# Patient Record
Sex: Male | Born: 1990 | Race: Black or African American | Hispanic: No | Marital: Single | Smoking: Never smoker
Health system: Southern US, Community
[De-identification: ages and names within clinical notes are randomized; demographics above are authoritative.]

## PROBLEM LIST (undated history)

## (undated) DIAGNOSIS — J189 Pneumonia, unspecified organism: Secondary | ICD-10-CM

---

## 2014-01-26 ENCOUNTER — Emergency Department (INDEPENDENT_AMBULATORY_CARE_PROVIDER_SITE_OTHER): Payer: BC Managed Care – PPO

## 2014-01-26 ENCOUNTER — Encounter (HOSPITAL_COMMUNITY): Payer: Self-pay | Admitting: Emergency Medicine

## 2014-01-26 ENCOUNTER — Emergency Department (HOSPITAL_COMMUNITY)
Admission: EM | Admit: 2014-01-26 | Discharge: 2014-01-26 | Disposition: A | Discharge status: Home / Self Care | Payer: BC Managed Care – PPO | Source: Home / Self Care | Attending: Emergency Medicine | Admitting: Emergency Medicine

## 2014-01-26 DIAGNOSIS — S239XXA Sprain of unspecified parts of thorax, initial encounter: Secondary | ICD-10-CM

## 2014-01-26 DIAGNOSIS — X58XXXA Exposure to other specified factors, initial encounter: Secondary | ICD-10-CM

## 2014-01-26 DIAGNOSIS — IMO0002 Reserved for concepts with insufficient information to code with codable children: Secondary | ICD-10-CM

## 2014-01-26 HISTORY — DX: Pneumonia, unspecified organism: J18.9

## 2014-01-26 MED ORDER — MELOXICAM 15 MG PO TABS: 15.0000 mg | ORAL_TABLET | Freq: Every day | ORAL | Status: DC

## 2014-01-26 MED ORDER — TRAMADOL HCL 50 MG PO TABS: 100.0000 mg | ORAL_TABLET | Freq: Three times a day (TID) | ORAL | Status: DC | PRN

## 2014-01-26 MED ORDER — METHOCARBAMOL 500 MG PO TABS: 500.0000 mg | ORAL_TABLET | Freq: Three times a day (TID) | ORAL | Status: DC

## 2014-01-26 NOTE — Discharge Instructions (Signed)
Do exercises twice daily followed by moist heat for 15 minutes. ° ° ° ° ° °Try to be as active as possible. ° °If no better in 2 weeks, follow up with orthopedist. ° ° °TREATMENT  °Treatment initially involves the use of ice and medication to help reduce pain and inflammation. It is also important to perform strengthening and stretching exercises and modify activities that worsen symptoms so the injury does not get worse. These exercises may be performed at home or with a therapist. For patients who experience severe symptoms, a soft padded collar may be recommended to be worn around the neck.  °Improving your posture may help reduce symptoms. Posture improvement includes pulling your chin and abdomen in while sitting or standing. If you are sitting, sit in a firm chair with your buttocks against the back of the chair. While sleeping, try replacing your pillow with a small towel rolled to 2 inches in diameter, or use a cervical pillow. Poor sleeping positions delay healing.  ° °MEDICATION  °· If pain medication is necessary, nonsteroidal anti-inflammatory medications, such as aspirin and ibuprofen, or other minor pain relievers, such as acetaminophen, are often recommended. °· Do not take pain medication for 7 days before surgery. °· Prescription pain relievers may be given if deemed necessary by your caregiver. Use only as directed and only as much as you need. ° °HEAT AND COLD:  °· Cold treatment (icing) relieves pain and reduces inflammation. Cold treatment should be applied for 10 to 15 minutes every 2 to 3 hours for inflammation and pain and immediately after any activity that aggravates your symptoms. Use ice packs or an ice massage. °· Heat treatment may be used prior to performing the stretching and strengthening activities prescribed by your caregiver, physical therapist, or athletic trainer. Use a heat pack or a warm soak. ° °SEEK MEDICAL CARE IF:  °· Symptoms get worse or do not improve in 2 weeks despite  treatment. °· New, unexplained symptoms develop (drugs used in treatment may produce side effects). ° °EXERCISES °RANGE OF MOTION (ROM) AND STRETCHING EXERCISES - Cervical Strain and Sprain °These exercises may help you when beginning to rehabilitate your injury. In order to successfully resolve your symptoms, you must improve your posture. These exercises are designed to help reduce the forward-head and rounded-shoulder posture which contributes to this condition. Your symptoms may resolve with or without further involvement from your physician, physical therapist or athletic trainer. While completing these exercises, remember:  °· Restoring tissue flexibility helps normal motion to return to the joints. This allows healthier, less painful movement and activity. °· An effective stretch should be held for at least 20 seconds, although you may need to begin with shorter hold times for comfort. °· A stretch should never be painful. You should only feel a gentle lengthening or release in the stretched tissue. ° °STRETCH- Axial Extensors °· Lie on your back on the floor. You may bend your knees for comfort. Place a rolled up hand towel or dish towel, about 2 inches in diameter, under the part of your head that makes contact with the floor. °· Gently tuck your chin, as if trying to make a "double chin," until you feel a gentle stretch at the base of your head. °· Hold _____10_____ seconds. °Repeat _____10_____ times. Complete this exercise _____2_____ times per day.  ° °STRETECH - Axial Extension  °· Stand or sit on a firm surface. Assume a good posture: chest up, shoulders drawn back, abdominal muscles slightly   tense, knees unlocked (if standing) and feet hip width apart. °· Slowly retract your chin so your head slides back and your chin slightly lowers.Continue to look straight ahead. °· You should feel a gentle stretch in the back of your head. Be certain not to feel an aggressive stretch since this can cause  headaches later. °· Hold for ____10______ seconds. °Repeat _____10_____ times. Complete this exercise ____2______ times per day. ° °STRETCH  Cervical Side Bend  °· Stand or sit on a firm surface. Assume a good posture: chest up, shoulders drawn back, abdominal muscles slightly tense, knees unlocked (if standing) and feet hip width apart. °· Without letting your nose or shoulders move, slowly tip your right / left ear to your shoulder until your feel a gentle stretch in the muscles on the opposite side of your neck. °· Hold _____10_____ seconds. °Repeat _____10_____ times. Complete this exercise _____2_____ times per day. ° °STRETCH  Cervical Rotators  °· Stand or sit on a firm surface. Assume a good posture: chest up, shoulders drawn back, abdominal muscles slightly tense, knees unlocked (if standing) and feet hip width apart. °· Keeping your eyes level with the ground, slowly turn your head until you feel a gentle stretch along the back and opposite side of your neck. °· Hold _____10_____ seconds. °Repeat ____10______ times. Complete this exercise ____2______ times per day. ° °RANGE OF MOTION - Neck Circles  °· Stand or sit on a firm surface. Assume a good posture: chest up, shoulders drawn back, abdominal muscles slightly tense, knees unlocked (if standing) and feet hip width apart. °· Gently roll your head down and around from the back of one shoulder to the back of the other. The motion should never be forced or painful. °· Repeat the motion 10-20 times, or until you feel the neck muscles relax and loosen. °Repeat ____10______ times. Complete the exercise _____2_____ times per day. ° °STRENGTHENING EXERCISES - Cervical Strain and Sprain °These exercises may help you when beginning to rehabilitate your injury. They may resolve your symptoms with or without further involvement from your physician, physical therapist or athletic trainer. While completing these exercises, remember:  °· Muscles can gain both the  endurance and the strength needed for everyday activities through controlled exercises. °· Complete these exercises as instructed by your physician, physical therapist or athletic trainer. Progress the resistance and repetitions only as guided. °· You may experience muscle soreness or fatigue, but the pain or discomfort you are trying to eliminate should never worsen during these exercises. If this pain does worsen, stop and make certain you are following the directions exactly. If the pain is still present after adjustments, discontinue the exercise until you can discuss the trouble with your clinician. ° °STRENGTH Cervical Flexors, Isometric °· Face a wall, standing about 6 inches away. Place a small pillow, a ball about 6-8 inches in diameter, or a folded towel between your forehead and the wall. °· Slightly tuck your chin and gently push your forehead into the soft object. Push only with mild to moderate intensity, building up tension gradually. Keep your jaw and forehead relaxed. °· Hold 10 to 20 seconds. Keep your breathing relaxed. °· Release the tension slowly. Relax your neck muscles completely before you start the next repetition. °Repeat _____10_____ times. Complete this exercise _____2_____ times per day. ° °STRENGTH- Cervical Lateral Flexors, Isometric  °· Stand about 6 inches away from a wall. Place a small pillow, a ball about 6-8 inches in diameter, or a folded   towel between the side of your head and the wall. °· Slightly tuck your chin and gently tilt your head into the soft object. Push only with mild to moderate intensity, building up tension gradually. Keep your jaw and forehead relaxed. °· Hold 10 to 20 seconds. Keep your breathing relaxed. °· Release the tension slowly. Relax your neck muscles completely before you start the next repetition. °Repeat _____10_____ times. Complete this exercise ____2______ times per day. ° °STRENGTH  Cervical Extensors, Isometric  °· Stand about 6 inches away from  a wall. Place a small pillow, a ball about 6-8 inches in diameter, or a folded towel between the back of your head and the wall. °· Slightly tuck your chin and gently tilt your head back into the soft object. Push only with mild to moderate intensity, building up tension gradually. Keep your jaw and forehead relaxed. °· Hold 10 to 20 seconds. Keep your breathing relaxed. °· Release the tension slowly. Relax your neck muscles completely before you start the next repetition. °Repeat _____10_____ times. Complete this exercise _____2_____ times per day. ° °POSTURE AND BODY MECHANICS CONSIDERATIONS - Cervical Strain and Sprain °Keeping correct posture when sitting, standing or completing your activities will reduce the stress put on different body tissues, allowing injured tissues a chance to heal and limiting painful experiences. The following are general guidelines for improved posture. Your physician or physical therapist will provide you with any instructions specific to your needs. While reading these guidelines, remember: °· The exercises prescribed by your provider will help you have the flexibility and strength to maintain correct postures. °· The correct posture provides the optimal environment for your joints to work. All of your joints have less wear and tear when properly supported by a spine with good posture. This means you will experience a healthier, less painful body. °· Correct posture must be practiced with all of your activities, especially prolonged sitting and standing. Correct posture is as important when doing repetitive low-stress activities (typing) as it is when doing a single heavy-load activity (lifting). °PROLONGED STANDING WHILE SLIGHTLY LEANING FORWARD °When completing a task that requires you to lean forward while standing in one place for a long time, place either foot up on a stationary 2-4 inch high object to help maintain the best posture. When both feet are on the ground, the low  back tends to lose its slight inward curve. If this curve flattens (or becomes too large), then the back and your other joints will experience too much stress, fatigue more quickly and can cause pain.  °RESTING POSITIONS °Consider which positions are most painful for you when choosing a resting position. If you have pain with flexion-based activities (sitting, bending, stooping, squatting), choose a position that allows you to rest in a less flexed posture. You would want to avoid curling into a fetal position on your side. If your pain worsens with extension-based activities (prolonged standing, working overhead), avoid resting in an extended position such as sleeping on your stomach. Most people will find more comfort when they rest with their spine in a more neutral position, neither too rounded nor too arched. Lying on a non-sagging bed on your side with a pillow between your knees, or on your back with a pillow under your knees will often provide some relief. Keep in mind, being in any one position for a prolonged period of time, no matter how correct your posture, can still lead to stiffness. °WALKING °Walk with an upright posture. Your ears,   shoulders and hips should all line-up. °OFFICE WORK °When working at a desk, create an environment that supports good, upright posture. Without extra support, muscles fatigue and lead to excessive strain on joints and other tissues. °CHAIR: °· A chair should be able to slide under your desk when your back makes contact with the back of the chair. This allows you to work closely. °· The chair's height should allow your eyes to be level with the upper part of your monitor and your hands to be slightly lower than your elbows. °· Body position: °· Your feet should make contact with the floor. If this is not possible, use a foot rest. °· Keep your ears over your shoulders. This will reduce stress on your neck and low back. °Document Released: 08/30/2005 Document Revised:  11/22/2011 Document Reviewed: 12/12/2008 °[employer]® Patient Information ©2013 [employer]. ° ° °

## 2014-01-26 NOTE — ED Notes (Signed)
Patient c/o upper back pain onset 2 months ago. Pt reports he had pneumonia as a child and feels the same type of pain. Felt SOB yesterday. Patient is alert and oriented and in no acute distress.

## 2014-01-26 NOTE — ED Provider Notes (Signed)
Chief Complaint   Chief Complaint  Patient presents with  . Back Pain    History of Present Illness   Brendan Kobuslbert Earl Mcree Jr. is a 23 year old male who's had a 2 month history of pain in his upper back between his shoulder blades. He denies any injury. No radiation down the arms or legs. No numbness, tingling, or weakness in the arms or legs. The pain is not worse with bending of the neck or the back. Not worse with movement the arms or deep inspiration. He denies fever, chills, weight loss, cough, or hemoptysis. No shortness of breath.  Review of Systems   Other than noted above, the patient denies any of the following symptoms: Constitutional:  No fever or chills. Neck:  No swelling, or adenopathy.   Cardiac:  No chest pain, tightness, or pressure. Respiratory:  No cough or dyspnea. M-S:  No joint pain, muscle pain, or back problems. Neuro:  No headache, muscle weakness, or paresthesias.  PMFSH   Past medical history, family history, social history, meds, and allergies were reviewed.    Physical Examination    Vital signs:  BP 128/70  Pulse 64  Temp(Src) 98.5 F (36.9 C) (Oral)  Resp 18  SpO2 100% General:  Alert, oriented and in no distress. Eye:  PERRL, full EOMs. ENT:  Pharynx clear, no oral lesions. Neck:  Nontender, no adenopathy or mass, full range of motion, no pain.   Lungs:  No respiratory distress.  Breath sounds clear and equal bilaterally.  No wheezes, rales or rhonchi. Heart:  Regular rhythm.  No gallops, murmers, or rubs. Back: Slight pain to palpation in the upper back between the shoulder blades. No lower back tenderness to palpation in the lower back a full range of motion with no pain. Ext:  No upper extremity edema, pulses full.  Full ROM of joints with no joint or muscle pain to palpation. Neuro:  Alert and oriented times 3.  No focal muscle weakness.  DTRs symmetric.  Sensation intact to light touch. Skin: Clear, warm and dry.  No rash.  Good  capillary refill.   Radiology   Dg Chest 2 View  01/26/2014   CLINICAL DATA:  Back pain.  EXAM: CHEST  2 VIEW  COMPARISON:  None.  FINDINGS: Normal cardiac and mediastinal contours. Lungs are clear. No pleural effusion or pneumothorax. Regional skeleton is unremarkable.  IMPRESSION: No acute cardiopulmonary process.   Electronically Signed   By: Annia Beltrew  Davis M.D.   On: 01/26/2014 17:13   I reviewed the images independently and personally and concur with the radiologist's findings.  Assessment   The encounter diagnosis was Thoracic sprain and strain.  No evidence of intrathoracic disease. This appears to be purely musculoskeletal. Suggested exercise and medications as outlined below.  Plan    1.  Meds:  The following meds were prescribed:   Discharge Medication List as of 01/26/2014  5:33 PM    START taking these medications   Details  meloxicam (MOBIC) 15 MG tablet Take 1 tablet (15 mg total) by mouth daily., Starting 01/26/2014, Until Discontinued, Normal    methocarbamol (ROBAXIN) 500 MG tablet Take 1 tablet (500 mg total) by mouth 3 (three) times daily., Starting 01/26/2014, Until Discontinued, Normal    traMADol (ULTRAM) 50 MG tablet Take 2 tablets (100 mg total) by mouth every 8 (eight) hours as needed., Starting 01/26/2014, Until Discontinued, Normal        2.  Patient Education/Counseling:  The patient was given appropriate  handouts, self care instructions, and instructed in symptomatic relief.  Given exercises to do twice daily.  3.  Follow up:  The patient was told to follow up here if no better in 3 to 4 days, or sooner if becoming worse in any way, and given some red flag symptoms such as worsening pain or new neurological symptoms which would prompt immediate return.  Followup with orthopedics if no improvement in 2 weeks.     Reuben Likesavid C Latrina Guttman, MD 01/26/14 Rosamaria [id number]

## 2014-08-27 ENCOUNTER — Encounter (HOSPITAL_COMMUNITY): Payer: Self-pay | Admitting: Emergency Medicine

## 2014-08-27 ENCOUNTER — Emergency Department (HOSPITAL_COMMUNITY)
Admission: EM | Admit: 2014-08-27 | Discharge: 2014-08-27 | Disposition: A | Discharge status: Home / Self Care | Payer: BC Managed Care – PPO | Source: Home / Self Care | Attending: Family Medicine | Admitting: Family Medicine

## 2014-08-27 DIAGNOSIS — R0982 Postnasal drip: Secondary | ICD-10-CM

## 2014-08-27 LAB — POCT RAPID STREP A: Streptococcus, Group A Screen (Direct): NEGATIVE

## 2014-08-27 NOTE — Discharge Instructions (Signed)
Upper Respiratory Infection, Adult You have sinus drainge Take Claritin or Allegra or Zyrtec for drainage. Lots of fluids An upper respiratory infection (URI) is also known as the common cold. It is often caused by a type of germ (virus). Colds are easily spread (contagious). You can pass it to others by kissing, coughing, sneezing, or drinking out of the same glass. Usually, you get better in 1 or 2 weeks.  HOME CARE   Only take medicine as told by your doctor.  Use a warm mist humidifier or breathe in steam from a hot shower.  Drink enough water and fluids to keep your pee (urine) clear or pale yellow.  Get plenty of rest.  Return to work when your temperature is back to normal or as told by your doctor. You may use a face mask and wash your hands to stop your cold from spreading. GET HELP RIGHT AWAY IF:   After the first few days, you feel you are getting worse.  You have questions about your medicine.  You have chills, shortness of breath, or brown or red spit (mucus).  You have yellow or brown snot (nasal discharge) or pain in the face, especially when you bend forward.  You have a fever, puffy (swollen) neck, pain when you swallow, or white spots in the back of your throat.  You have a bad headache, ear pain, sinus pain, or chest pain.  You have a high-pitched whistling sound when you breathe in and out (wheezing).  You have a lasting cough or cough up blood.  You have sore muscles or a stiff neck. MAKE SURE YOU:   Understand these instructions.  Will watch your condition.  Will get help right away if you are not doing well or get worse. Document Released: 02/16/2008 Document Revised: 11/22/2011 Document Reviewed: 12/05/2013 [facility] Patient Information 2015 [place], [place]. This information is not intended to replace advice given to you by your health care provider. Make sure you discuss any questions you have with your health care provider.

## 2014-08-27 NOTE — ED Provider Notes (Signed)
CSN: [id number]     Arrival date & time 08/27/14  1743 History   First MD Initiated Contact with Patient 08/27/14 1757     Chief Complaint  Patient presents with  . Sore Throat   (Consider location/radiation/quality/duration/timing/severity/associated sxs/prior Treatment) HPI Comments: Feels like something stuck in lower throat for 1 month. No problems with swallowing. No pain, no sore throat, no cough. Has to clear throat frequently.   Past Medical History  Diagnosis Date  . Pneumonia in child    History reviewed. No pertinent past surgical history. No family history on file. History  Substance Use Topics  . Smoking status: Never Smoker   . Smokeless tobacco: Not on file  . Alcohol Use: No    Review of Systems  Constitutional: Negative for fever and activity change.  HENT: Negative for congestion, ear discharge, postnasal drip, rhinorrhea, sore throat, trouble swallowing and voice change.   Eyes: Negative.   Respiratory: Negative.     Allergies  Review of patient's allergies indicates no known allergies.  Home Medications   Prior to Admission medications   Medication Sig Start Date End Date Taking? Authorizing Provider  meloxicam (MOBIC) 15 MG tablet Take 1 tablet (15 mg total) by mouth daily. 01/26/14   Reuben Likesavid C Keller, MD  methocarbamol (ROBAXIN) 500 MG tablet Take 1 tablet (500 mg total) by mouth 3 (three) times daily. 01/26/14   Reuben Likesavid C Keller, MD  traMADol (ULTRAM) 50 MG tablet Take 2 tablets (100 mg total) by mouth every 8 (eight) hours as needed. 01/26/14   Reuben Likesavid C Keller, MD   BP 123/79 mmHg  Pulse 62  Temp(Src) 98.5 F (36.9 C) (Oral)  Resp 16  SpO2 97% Physical Exam  Constitutional: He is oriented to person, place, and time. He appears well-developed. No distress.  HENT:  Head: Normocephalic and atraumatic.  Mouth/Throat: No oropharyngeal exudate.  Biolat TM's nl OP with thick PND  Eyes: Conjunctivae and EOM are normal.  Neck: Normal range of motion.  Neck supple.  Cardiovascular: Normal rate, regular rhythm and normal heart sounds.   Pulmonary/Chest: Effort normal and breath sounds normal. No respiratory distress.  Lymphadenopathy:    He has no cervical adenopathy.  Neurological: He is alert and oriented to person, place, and time.  Skin: Skin is warm and dry.  Psychiatric: He has a normal mood and affect.  Nursing note and vitals reviewed.   ED Course  Procedures (including critical care time) Labs Review Labs Reviewed  POCT RAPID STREP A ([facility] ONLY)    Imaging Review No results found. Results for orders placed or performed during the hospital encounter of 08/27/14  POCT rapid strep A [facility])  Result Value Ref Range   Streptococcus, Group A Screen (Direct) NEGATIVE NEGATIVE     MDM   1. PND (post-nasal drip)    You have sinus drainge Take Claritin or Allegra or Zyrtec for drainage. Lots of fluids      Hayden Rasmussenavid Tarris [place]bene, NP 08/27/14 [phone]

## 2014-08-27 NOTE — ED Notes (Signed)
Sore throat for one month, and reports feels like something in throat.  Symptoms have been intermittent for one month

## 2014-08-30 ENCOUNTER — Telehealth (HOSPITAL_COMMUNITY): Payer: Self-pay | Admitting: Family Medicine

## 2014-08-30 LAB — CULTURE, GROUP A STREP

## 2014-08-30 MED ORDER — AMOXICILLIN 500 MG PO CAPS
500.0000 mg | ORAL_CAPSULE | Freq: Two times a day (BID) | ORAL | Status: DC
Start: 2014-08-30 — End: 2014-11-19

## 2014-08-30 NOTE — ED Notes (Signed)
Patient continues to have sore throat. Strep culture positive for Streptococcus bacteria. Amoxicillin called in.  Rodolph BongEvan S Kiyara Bouffard, MD 08/30/14 1500

## 2014-11-19 ENCOUNTER — Encounter (HOSPITAL_COMMUNITY): Payer: Self-pay | Admitting: Emergency Medicine

## 2014-11-19 ENCOUNTER — Emergency Department (HOSPITAL_COMMUNITY)
Admission: EM | Admit: 2014-11-19 | Discharge: 2014-11-19 | Disposition: A | Discharge status: Home / Self Care | Payer: BC Managed Care – PPO | Source: Home / Self Care | Attending: Family Medicine | Admitting: Family Medicine

## 2014-11-19 DIAGNOSIS — R69 Illness, unspecified: Principal | ICD-10-CM

## 2014-11-19 DIAGNOSIS — J111 Influenza due to unidentified influenza virus with other respiratory manifestations: Secondary | ICD-10-CM

## 2014-11-19 MED ORDER — GUAIFENESIN-CODEINE 100-10 MG/5ML PO SOLN: 5.0000 mL | Freq: Every evening | ORAL | Status: AC | PRN

## 2014-11-19 MED ORDER — OSELTAMIVIR PHOSPHATE 75 MG PO CAPS: 75.0000 mg | ORAL_CAPSULE | Freq: Two times a day (BID) | ORAL | Status: AC

## 2014-11-19 MED ORDER — IPRATROPIUM BROMIDE 0.06 % NA SOLN: 2.0000 | Freq: Four times a day (QID) | NASAL | Status: AC

## 2014-11-19 NOTE — Discharge Instructions (Signed)
Thank you for coming in today. Call or go to the emergency room if you get worse, have trouble breathing, have chest pains, or palpitations.   Take codeine cough syrup as needed at bedtime for cough. Do not drive after taking this medication. Take 2 Aleve twice daily for pain and bodyaches fevers and chills. Use nasal spray.  Influenza Influenza ("the flu") is a viral infection of the respiratory tract. It occurs more often in winter months because people spend more time in close contact with one another. Influenza can make you feel very sick. Influenza easily spreads from person to person (contagious). CAUSES  Influenza is caused by a virus that infects the respiratory tract. You can catch the virus by breathing in droplets from an infected person's cough or sneeze. You can also catch the virus by touching something that was recently contaminated with the virus and then touching your mouth, nose, or eyes. RISKS AND COMPLICATIONS You may be at risk for a more severe case of influenza if you smoke cigarettes, have diabetes, have chronic heart disease (such as heart failure) or lung disease (such as asthma), or if you have a weakened immune system. Elderly people and pregnant women are also at risk for more serious infections. The most common problem of influenza is a lung infection (pneumonia). Sometimes, this problem can require emergency medical care and may be life threatening. SIGNS AND SYMPTOMS  Symptoms typically last 4 to 10 days and may include:  Fever.  Chills.  Headache, body aches, and muscle aches.  Sore throat.  Chest discomfort and cough.  Poor appetite.  Weakness or feeling tired.  Dizziness.  Nausea or vomiting. DIAGNOSIS  Diagnosis of influenza is often made based on your history and a physical exam. A nose or throat swab test can be done to confirm the diagnosis. TREATMENT  In mild cases, influenza goes away on its own. Treatment is directed at relieving symptoms.  For more severe cases, your health care provider may prescribe antiviral medicines to shorten the sickness. Antibiotic medicines are not effective because the infection is caused by a virus, not by bacteria. HOME CARE INSTRUCTIONS  Take medicines only as directed by your health care provider.  Use a cool mist humidifier to make breathing easier.  Get plenty of rest until your temperature returns to normal. This usually takes 3 to 4 days.  Drink enough fluid to keep your urine clear or pale yellow.  Cover yourmouth and nosewhen coughing or sneezing,and wash your handswellto prevent thevirusfrom spreading.  Stay homefromwork orschool untilthe fever is gonefor at least 531full day. PREVENTION  An annual influenza vaccination (flu shot) is the best way to avoid getting influenza. An annual flu shot is now routinely recommended for all adults in the [place] SEEK MEDICAL CARE IF:  You experiencechest pain, yourcough worsens,or you producemore mucus.  Youhave nausea,vomiting, ordiarrhea.  Your fever returns or gets worse. SEEK IMMEDIATE MEDICAL CARE IF:  You havetrouble breathing, you become short of breath,or your skin ornails becomebluish.  You have severe painor stiffnessin the neck.  You develop a sudden headache, or pain in the face or ear.  You have nausea or vomiting that you cannot control. MAKE SURE YOU:   Understand these instructions.  Will watch your condition.  Will get help right away if you are not doing well or get worse. Document Released: 08/27/2000 Document Revised: 01/14/2014 Document Reviewed: 11/29/2011 [facility] Patient Information 2015 [employer], [place]. This information is not intended to replace advice given to  you by your health care provider. Make sure you discuss any questions you have with your health care provider.

## 2014-11-19 NOTE — ED Provider Notes (Signed)
Brendan Patton Earl Selinda Michaelsridgen Jr. is a 24 y.o. male who presents to Urgent Care today for body aches had a cough congestion sore throat chills and sweats. Symptoms present for one day. No vomiting or diarrhea. Patient has tried amoxicillin and Sudafed which helped a little. He did not receive a flu vaccine this year. He feels well otherwise.   Past Medical History  Diagnosis Date  . Pneumonia in child    History reviewed. No pertinent past surgical history. History  Substance Use Topics  . Smoking status: Never Smoker   . Smokeless tobacco: Not on file  . Alcohol Use: Yes   ROS as above Medications: No current facility-administered medications for this encounter.   Current Outpatient Prescriptions  Medication Sig Dispense Refill  . guaiFENesin (MUCINEX) 600 MG 12 hr tablet Take by mouth 2 (two) times daily.    . pseudoephedrine (SUDAFED) 120 MG 12 hr tablet Take 120 mg by mouth 2 (two) times daily.    Marland Kitchen. guaiFENesin-codeine 100-10 MG/5ML syrup Take 5 mLs by mouth at bedtime as needed for cough. 120 mL 0  . ipratropium (ATROVENT) 0.06 % nasal spray Place 2 sprays into both nostrils 4 (four) times daily. 15 mL 1  . oseltamivir (TAMIFLU) 75 MG capsule Take 1 capsule (75 mg total) by mouth every 12 (twelve) hours. 10 capsule 0   No Known Allergies   Exam:  BP 122/80 mmHg  Pulse 91  Temp(Src) 99.9 F (37.7 C) (Oral)  Resp 18  SpO2 97% Gen: Well NAD HEENT: EOMI,  MMM posterior pharynx with cobblestoning. Normal tympanic membranes bilaterally. Lungs: Normal work of breathing. CTABL Heart: RRR no MRG Abd: NABS, Soft. Nondistended, Nontender Exts: Brisk capillary refill, warm and well perfused.   No results found for this or any previous visit (from the past 24 hour(s)). No results found.  Assessment and Plan: 24 y.o. male with influenza-like illness. Treat with Tamiflu, codeine cough syrup, and Atrovent nasal spray.  Discussed warning signs or symptoms. Please see discharge  instructions. Patient expresses understanding.     Rodolph BongEvan S Naquan Garman, MD 11/19/14 1324

## 2014-11-19 NOTE — ED Notes (Addendum)
Aching last night, headache, cough, feeling like something in throat and cannot get throat cleared.  Unknown if he has had fever.  Chills/sweats.  No nausea, no vomiting, no diarrhea.  Patient had pneumonia as a teenager and concerned for reoccurance

## 2020-06-08 ENCOUNTER — Other Ambulatory Visit: Payer: Self-pay

## 2020-06-08 ENCOUNTER — Emergency Department (HOSPITAL_COMMUNITY)
Admission: EM | Admit: 2020-06-08 | Discharge: 2020-06-08 | Disposition: A | Discharge status: Home / Self Care | Payer: BC Managed Care – PPO | Attending: Emergency Medicine | Admitting: Emergency Medicine

## 2020-06-08 ENCOUNTER — Encounter (HOSPITAL_COMMUNITY): Payer: Self-pay | Admitting: Obstetrics and Gynecology

## 2020-06-08 ENCOUNTER — Emergency Department (HOSPITAL_COMMUNITY): Payer: BC Managed Care – PPO

## 2020-06-08 DIAGNOSIS — J1282 Pneumonia due to coronavirus disease 2019: Secondary | ICD-10-CM | POA: Diagnosis not present

## 2020-06-08 DIAGNOSIS — U071 COVID-19: Secondary | ICD-10-CM | POA: Insufficient documentation

## 2020-06-08 DIAGNOSIS — R0602 Shortness of breath: Secondary | ICD-10-CM | POA: Diagnosis present

## 2020-06-08 LAB — RESPIRATORY PANEL BY RT PCR (FLU A&B, COVID)
Influenza A by PCR: NEGATIVE
Influenza B by PCR: NEGATIVE
SARS Coronavirus 2 by RT PCR: POSITIVE — AB

## 2020-06-08 MED ORDER — ACETAMINOPHEN 500 MG PO TABS
1000.0000 mg | ORAL_TABLET | Freq: Once | ORAL | Status: AC
  Administered 2020-06-08: 1000 mg via ORAL
  Filled 2020-06-08: qty 2

## 2020-06-08 NOTE — ED Provider Notes (Signed)
[facility] Provider Note   CSN: [id number] Arrival date & time: 06/08/20  1514     History Chief Complaint  Patient presents with  . Shortness of Breath    Brendan Marchena Landrum Carbonell. is a 29 y.o. male.  Brendan Upshaw Lashon Hillier. is a 29 y.o. male who is otherwise healthy, presents to the emergency department for evaluation of shortness of breath, chills, cough, congestion and fatigue.  Patient states that sometime last week he was exposed to someone with Covid, he has not received his vaccine, and has been having symptoms for about 6 days now.  He states that today he started to feel short of breath, before this he thought he just had a head cold.  He denies any associated chest pain.  Cough is occasionally productive of mucus.  Patient was not aware he had a fever until he got here today but he has had frequent chills.  He has not used any medications to treat his symptoms.  No other aggravating or alleviating factors.        Past Medical History:  Diagnosis Date  . Pneumonia in child     There are no problems to display for this patient.   History reviewed. No pertinent surgical history.     No family history on file.  Social History   Tobacco Use  . Smoking status: Never Smoker  . Smokeless tobacco: Never Used  Vaping Use  . Vaping Use: Never used  Substance Use Topics  . Alcohol use: Yes  . Drug use: No    Home Medications Prior to Admission medications   Medication Sig Start Date End Date Taking? Authorizing Provider  guaiFENesin (MUCINEX) 600 MG 12 hr tablet Take by mouth 2 (two) times daily.    [provider]  guaiFENesin-codeine 100-10 MG/5ML syrup Take 5 mLs by mouth at bedtime as needed for cough. 11/19/14   Rodolph Bong, MD  ipratropium (ATROVENT) 0.06 % nasal spray Place 2 sprays into both nostrils 4 (four) times daily. 11/19/14   Rodolph Bong, MD  oseltamivir (TAMIFLU) 75 MG capsule Take 1 capsule (75 mg total)  by mouth every 12 (twelve) hours. 11/19/14   Rodolph Bong, MD  pseudoephedrine (SUDAFED) 120 MG 12 hr tablet Take 120 mg by mouth 2 (two) times daily.    [provider]    Allergies    Patient has no known allergies.  Review of Systems   Review of Systems  Constitutional: Positive for appetite change, chills and fatigue. Negative for fever.  HENT: Positive for congestion and rhinorrhea.   Respiratory: Positive for cough and shortness of breath.   Cardiovascular: Negative for chest pain.  Gastrointestinal: Negative for abdominal pain, diarrhea, nausea and vomiting.  Musculoskeletal: Positive for myalgias.  Skin: Negative for color change and rash.  Neurological: Negative for headaches.    Physical Exam Updated Vital Signs BP 131/80 (BP Location: Left Arm)   Pulse 99   Temp (!) 101.1 F (38.4 C) (Oral)   Resp (!) 25   SpO2 97%   Physical Exam Vitals and nursing note reviewed.  Constitutional:      General: He is not in acute distress.    Appearance: Normal appearance. He is well-developed. He is not ill-appearing or diaphoretic.     Comments: Well-appearing and in no distress  HENT:     Head: Normocephalic and atraumatic.     Nose: Congestion and rhinorrhea present.     Mouth/Throat:  Mouth: Mucous membranes are moist.     Pharynx: Oropharynx is clear. No oropharyngeal exudate or posterior oropharyngeal erythema.  Eyes:     General:        Right eye: No discharge.        Left eye: No discharge.  Neck:     Comments: No rigidity Cardiovascular:     Rate and Rhythm: Normal rate and regular rhythm.     Heart sounds: Normal heart sounds.  Pulmonary:     Effort: Pulmonary effort is normal. No respiratory distress.     Breath sounds: Normal breath sounds.     Comments: Respirations equal and unlabored, patient able to speak in full sentences, satting 97% on room air, lungs clear to auscultation bilaterally Chest:     Chest wall: No tenderness.  Abdominal:      General: Bowel sounds are normal. There is no distension.     Palpations: Abdomen is soft. There is no mass.     Tenderness: There is no abdominal tenderness. There is no guarding.     Comments: Abdomen soft, nondistended, nontender to palpation in all quadrants without guarding or peritoneal signs  Musculoskeletal:        General: No deformity.     Cervical back: Neck supple.  Lymphadenopathy:     Cervical: No cervical adenopathy.  Skin:    General: Skin is warm and dry.     Capillary Refill: Capillary refill takes less than 2 seconds.  Neurological:     Mental Status: He is alert and oriented to person, place, and time.  Psychiatric:        Mood and Affect: Mood normal.        Behavior: Behavior normal.     ED Results / Procedures / Treatments   Labs (all labs ordered are listed, but only abnormal results are displayed) Labs Reviewed  RESPIRATORY PANEL BY RT PCR (FLU A&B, COVID) - Abnormal; Notable for the following components:      Result Value   SARS Coronavirus 2 by RT PCR POSITIVE (*)    All other components within normal limits    EKG EKG Interpretation  Date/Time:  Sunday June 08 2020 15:39:50 EDT Ventricular Rate:  101 PR Interval:    QRS Duration: 74 QT Interval:  320 QTC Calculation: 415 R Axis:   60 Text Interpretation: Sinus tachycardia 12 Lead; Mason-Likar NO STEMI Confirmed by Alvester Chou [phone]) on 06/08/2020 3:50:49 PM   Radiology DG Chest Portable 1 View  Result Date: 06/08/2020 CLINICAL DATA:  Shortness of breath.  Cough.  COVID positive. EXAM: PORTABLE CHEST 1 VIEW COMPARISON:  01/26/2014 FINDINGS: Patchy opacity at the right lung base with vague ill-defined left basilar opacity. The heart is normal in size with normal mediastinal contours. No pneumothorax and pneumomediastinum. No pleural fluid. No acute osseous abnormalities are seen. IMPRESSION: Patchy bibasilar opacities, right greater than left, suspicious for pneumonia, pattern typical  of COVID-19. Electronically Signed   By: Narda Rutherford M.D.   On: 06/08/2020 16:20    Procedures Procedures (including critical care time)  Medications Ordered in ED Medications  acetaminophen (TYLENOL) tablet 1,000 mg (1,000 mg Oral Given 06/08/20 1618)    ED Course  I have reviewed the triage vital signs and the nursing notes.  Pertinent labs & imaging results that were available during my care of the patient were reviewed by me and considered in my medical decision making (see chart for details).    MDM Rules/Calculators/A&P  29 year old male presents with shortness of breath, cough, fever and fatigue, had a Covid exposure last week and has been having symptoms for about 6 days now.  Initially thought his symptoms were just a head cold but over the past few days he has felt short of breath.  No associated chest pain.  On arrival he is febrile to 101, but other vitals normal.  No hypoxia.  No increased work of breathing and lungs are clear.  High clinical suspicion for Covid will get test as well as chest x-ray and EKG.  Patient's Covid test is positive and his chest x-ray shows bibasilar patchy opacities suspicious for Covid pneumonia.  Patient ambulated in the department and maintained normal O2 sats without significant increased work of breathing or tachycardia.  Overall patient symptoms are mild, he is unvaccinated.  Discussed monoclonal antibody infusion with the patient but he is not interested in receiving this at this time.  Discussed symptomatic treatment and strict return precautions encourage patient to purchase home pulse ox and return if oxygen is below 92%.  Expresses understanding and agreement and is discharged home in good condition.  Brendan Cola Ashaun Gaughan. was evaluated in Emergency Department on 06/10/2020 for the symptoms described in the history of present illness. He was evaluated in the context of the global COVID-19 pandemic, which  necessitated consideration that the patient might be at risk for infection with the SARS-CoV-2 virus that causes COVID-19. Institutional protocols and algorithms that pertain to the evaluation of patients at risk for COVID-19 are in a state of rapid change based on information released by regulatory bodies including the CDC and federal and state organizations. These policies and algorithms were followed during the patient's care in the ED.  Final Clinical Impression(s) / ED Diagnoses Final diagnoses:  Pneumonia due to COVID-19 virus    Rx / DC Orders ED Discharge Orders    None       Legrand Rams 06/10/20 1155    Terald Sleeper, MD 06/11/20 [phone]

## 2020-06-08 NOTE — ED Triage Notes (Signed)
Patient reports to the ER for SOB. Patient reports he knows someone who was exposed to COVID and wants to get checked. Patient reports he has a cough and is unvaccinated.

## 2020-06-08 NOTE — ED Notes (Signed)
Ambulated pt for 3 minutes. Oxygen saturation levels stayed between 97-99%.

## 2020-06-08 NOTE — Discharge Instructions (Addendum)
You have tested positive for COVID-19 virus.  Please continue to quarantine at home and monitor your symptoms closely.  Your chest x-ray shows signs of Covid pneumonia. Antibiotics are not helpful in treating viral infection, the virus should run its course in about 10-14 days. Please make sure you are drinking plenty of fluids. You can treat your symptoms supportively with ibuprofen and tylenol for fevers and pains, and over the counter cough medications like Mucinex and throat lozenges to help with cough. If your symptoms are not improving please follow up with you Primary doctor.   I recommend that you purchase a home pulse ox to help better monitor your oxygen at home, if you start to have increased work of breathing or shortness of breath or your oxygen drops below 90% please immediately return to the hospital for reevaluation.  If you develop persistent fevers, shortness of breath or difficulty breathing, chest pain, severe headache and neck pain, persistent nausea and vomiting or other new or concerning symptoms return to the Emergency department.

## 2020-06-09 ENCOUNTER — Telehealth (HOSPITAL_COMMUNITY): Payer: Self-pay | Admitting: Nurse Practitioner

## 2020-06-09 DIAGNOSIS — U071 COVID-19: Secondary | ICD-10-CM

## 2020-06-09 NOTE — Telephone Encounter (Signed)
Called to Discuss with patient about Covid symptoms and the use of regeneron, a monoclonal antibody infusion for those with mild to moderate Covid symptoms and at a high risk of hospitalization.     Pt is qualified for this infusion at the [facility] due to co-morbid conditions and/or a member of an at-risk group.     Unable to reach pt. Unable to leave message. No mychart account. Sent text message.   Consuello Masse, DNP, AGNP-C [phone] (Infusion Center Hotline)

## 2021-04-08 IMAGING — DX DG CHEST 1V PORT
1 series · 1 of 1 positions shown · non-contrast
Comparison: 01/26/2014

CLINICAL DATA: Shortness of breath.  Cough.  COVID positive.

EXAM:
PORTABLE CHEST 1 VIEW

[chest ap]
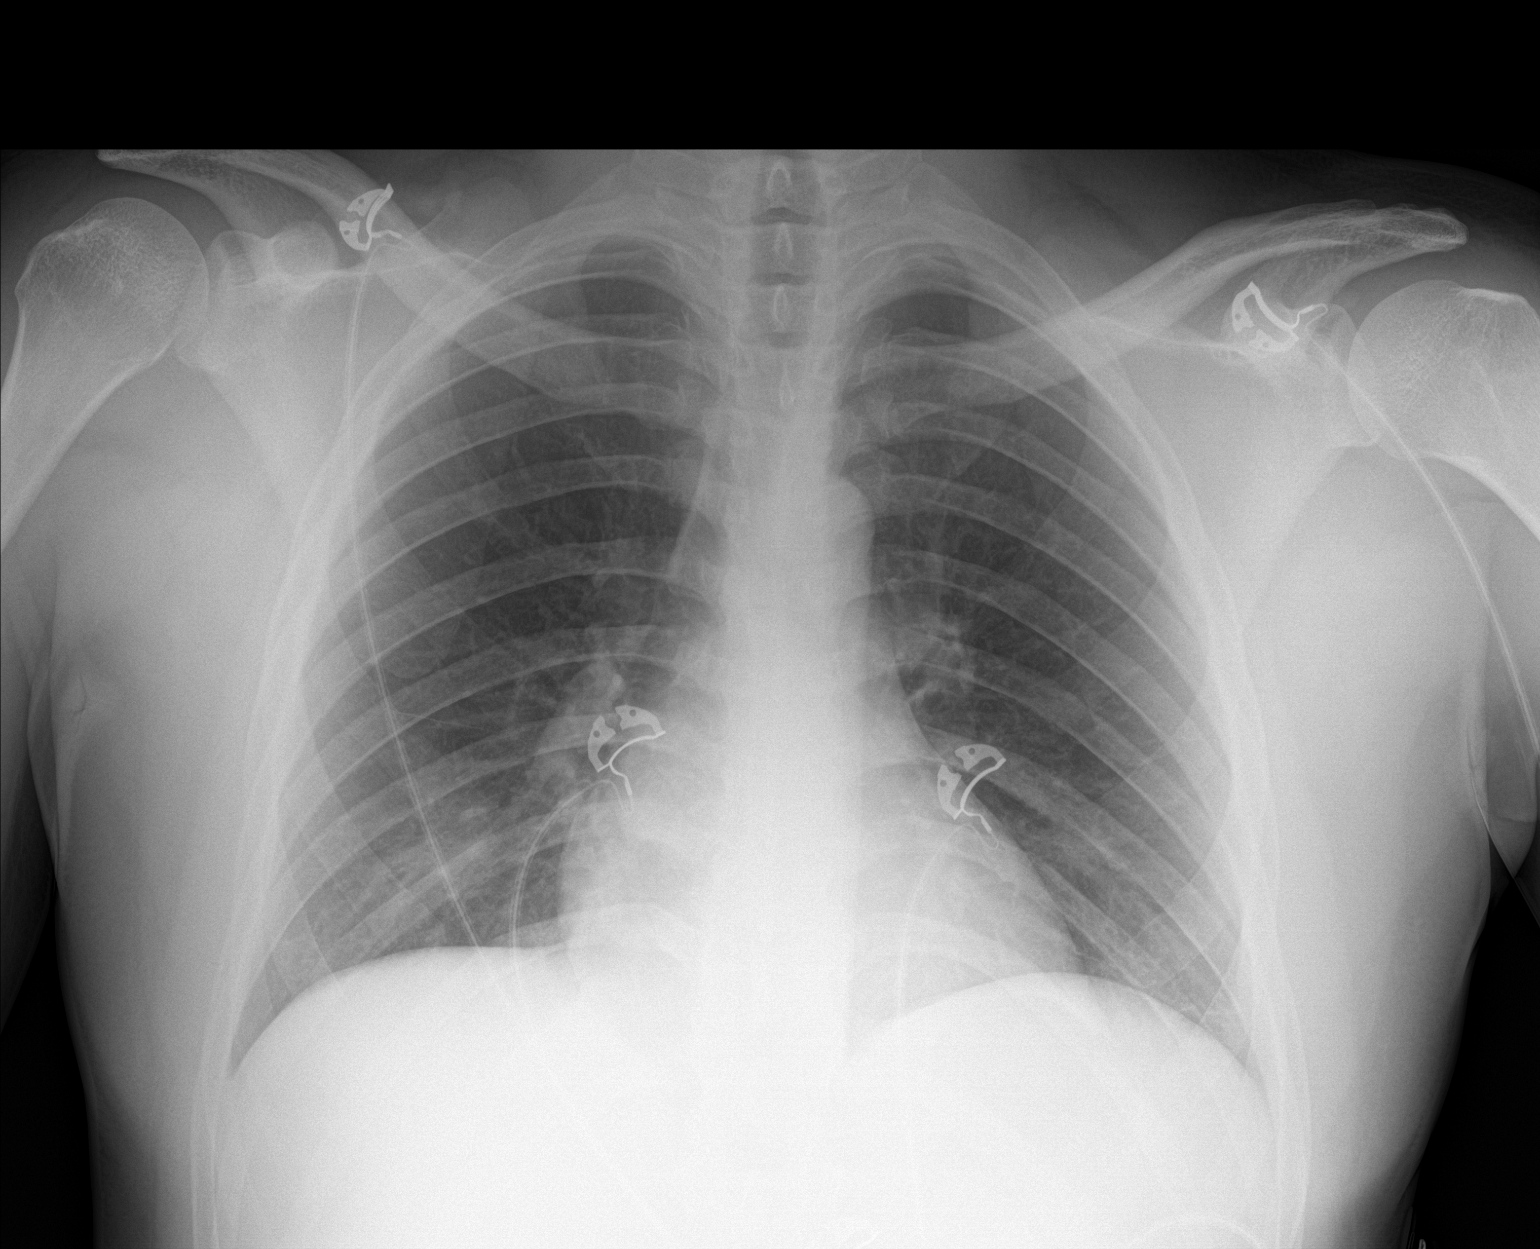

[1 of 1 positions shown; findings below may reference images not displayed]

FINDINGS: Patchy opacity at the right lung base with vague ill-defined left
basilar opacity. The heart is normal in size with normal mediastinal
contours. No pneumothorax and pneumomediastinum. No pleural fluid.
No acute osseous abnormalities are seen.
IMPRESSION: Patchy bibasilar opacities, right greater than left, suspicious for
pneumonia, pattern typical of [id number].
